# Patient Record
Sex: Male | Born: 1955 | Race: White | Hispanic: No | Marital: Married
Health system: Southern US, Community
[De-identification: ages and names within clinical notes are randomized; demographics above are authoritative.]

## PROBLEM LIST (undated history)

## (undated) DIAGNOSIS — G610 Guillain-Barre syndrome: Secondary | ICD-10-CM

## (undated) DIAGNOSIS — C61 Malignant neoplasm of prostate: Secondary | ICD-10-CM

## (undated) DIAGNOSIS — I10 Essential (primary) hypertension: Secondary | ICD-10-CM

---

## 1988-02-13 HISTORY — PX: OTHER SURGICAL HISTORY: SHX169

## 2002-02-12 HISTORY — PX: VASECTOMY: SHX75

## 2019-08-26 ENCOUNTER — Other Ambulatory Visit: Payer: Self-pay | Admitting: Urology

## 2019-08-26 DIAGNOSIS — C61 Malignant neoplasm of prostate: Secondary | ICD-10-CM

## 2019-09-26 ENCOUNTER — Ambulatory Visit
Admission: RE | Admit: 2019-09-26 | Discharge: 2019-09-26 | Disposition: A | Discharge status: Home / Self Care | Payer: Medicare Other | Source: Ambulatory Visit | Attending: Urology | Admitting: Urology

## 2019-09-26 ENCOUNTER — Other Ambulatory Visit: Payer: Self-pay

## 2019-09-26 DIAGNOSIS — C61 Malignant neoplasm of prostate: Secondary | ICD-10-CM

## 2019-09-26 MED ORDER — GADOBENATE DIMEGLUMINE 529 MG/ML IV SOLN
15.0000 mL | Freq: Once | INTRAVENOUS | Status: AC | PRN
  Administered 2019-09-26: 15 mL via INTRAVENOUS

## 2020-03-14 DIAGNOSIS — U071 COVID-19: Secondary | ICD-10-CM | POA: Diagnosis not present

## 2021-05-11 DIAGNOSIS — M170 Bilateral primary osteoarthritis of knee: Secondary | ICD-10-CM | POA: Diagnosis not present

## 2021-05-12 DIAGNOSIS — E349 Endocrine disorder, unspecified: Secondary | ICD-10-CM | POA: Diagnosis not present

## 2021-05-12 DIAGNOSIS — R948 Abnormal results of function studies of other organs and systems: Secondary | ICD-10-CM | POA: Diagnosis not present

## 2021-05-19 ENCOUNTER — Other Ambulatory Visit: Payer: Self-pay | Admitting: Urology

## 2021-05-19 DIAGNOSIS — C61 Malignant neoplasm of prostate: Secondary | ICD-10-CM

## 2021-06-30 ENCOUNTER — Ambulatory Visit
Admission: RE | Admit: 2021-06-30 | Discharge: 2021-06-30 | Disposition: A | Discharge status: Home / Self Care | Payer: Medicare Other | Source: Ambulatory Visit | Attending: Urology | Admitting: Urology

## 2021-06-30 DIAGNOSIS — K5730 Diverticulosis of large intestine without perforation or abscess without bleeding: Secondary | ICD-10-CM | POA: Diagnosis not present

## 2021-06-30 DIAGNOSIS — C61 Malignant neoplasm of prostate: Secondary | ICD-10-CM

## 2021-06-30 DIAGNOSIS — N3289 Other specified disorders of bladder: Secondary | ICD-10-CM | POA: Diagnosis not present

## 2021-06-30 MED ORDER — GADOBENATE DIMEGLUMINE 529 MG/ML IV SOLN
20.0000 mL | Freq: Once | INTRAVENOUS | Status: AC | PRN
  Administered 2021-06-30: 20 mL via INTRAVENOUS

## 2021-07-31 DIAGNOSIS — G4710 Hypersomnia, unspecified: Secondary | ICD-10-CM | POA: Diagnosis not present

## 2021-08-01 DIAGNOSIS — G4710 Hypersomnia, unspecified: Secondary | ICD-10-CM | POA: Diagnosis not present

## 2021-08-03 ENCOUNTER — Other Ambulatory Visit: Payer: Self-pay | Admitting: Urology

## 2021-08-03 DIAGNOSIS — M9984 Other biomechanical lesions of sacral region: Secondary | ICD-10-CM

## 2021-08-09 DIAGNOSIS — G4733 Obstructive sleep apnea (adult) (pediatric): Secondary | ICD-10-CM | POA: Diagnosis not present

## 2021-08-12 ENCOUNTER — Ambulatory Visit
Admission: RE | Admit: 2021-08-12 | Discharge: 2021-08-12 | Disposition: A | Discharge status: Home / Self Care | Payer: Medicare Other | Source: Ambulatory Visit | Attending: Urology | Admitting: Urology

## 2021-08-12 DIAGNOSIS — N3289 Other specified disorders of bladder: Secondary | ICD-10-CM | POA: Diagnosis not present

## 2021-08-12 DIAGNOSIS — M160 Bilateral primary osteoarthritis of hip: Secondary | ICD-10-CM | POA: Diagnosis not present

## 2021-08-12 DIAGNOSIS — M533 Sacrococcygeal disorders, not elsewhere classified: Secondary | ICD-10-CM | POA: Diagnosis not present

## 2021-08-12 DIAGNOSIS — M9984 Other biomechanical lesions of sacral region: Secondary | ICD-10-CM

## 2021-08-12 DIAGNOSIS — G4733 Obstructive sleep apnea (adult) (pediatric): Secondary | ICD-10-CM | POA: Diagnosis not present

## 2021-08-12 MED ORDER — GADOBENATE DIMEGLUMINE 529 MG/ML IV SOLN
20.0000 mL | Freq: Once | INTRAVENOUS | Status: AC | PRN
  Administered 2021-08-12: 20 mL via INTRAVENOUS

## 2021-08-24 ENCOUNTER — Other Ambulatory Visit: Payer: Self-pay | Admitting: Urology

## 2021-08-24 ENCOUNTER — Other Ambulatory Visit (HOSPITAL_COMMUNITY): Payer: Self-pay | Admitting: Urology

## 2021-08-24 DIAGNOSIS — D480 Neoplasm of uncertain behavior of bone and articular cartilage: Secondary | ICD-10-CM

## 2021-08-25 DIAGNOSIS — G4733 Obstructive sleep apnea (adult) (pediatric): Secondary | ICD-10-CM | POA: Diagnosis not present

## 2021-09-05 DIAGNOSIS — G4733 Obstructive sleep apnea (adult) (pediatric): Secondary | ICD-10-CM | POA: Diagnosis not present

## 2021-09-08 DIAGNOSIS — R935 Abnormal findings on diagnostic imaging of other abdominal regions, including retroperitoneum: Secondary | ICD-10-CM | POA: Diagnosis not present

## 2021-09-08 DIAGNOSIS — R222 Localized swelling, mass and lump, trunk: Secondary | ICD-10-CM | POA: Diagnosis not present

## 2021-09-08 DIAGNOSIS — M533 Sacrococcygeal disorders, not elsewhere classified: Secondary | ICD-10-CM | POA: Diagnosis not present

## 2021-09-08 DIAGNOSIS — D480 Neoplasm of uncertain behavior of bone and articular cartilage: Secondary | ICD-10-CM | POA: Diagnosis not present

## 2021-09-18 ENCOUNTER — Encounter: Payer: Self-pay | Admitting: *Deleted

## 2021-09-18 NOTE — Progress Notes (Unsigned)
Keith Peaches, MD  Keith Mckusick, DO; Keith Medici Noreene Filbert, MD; Keith Mariscal, MD Cc: P Ir Procedure Requests I spoke with Dr. Junious Silk about this case and recommended the CT scan.  This guy is in a tough position and open biopsy would be a much bigger procedure.  I believe it can be approached with CT guidance.   Please schedule for me at [facility].   Thanks,   HKM

## 2021-09-25 DIAGNOSIS — G4733 Obstructive sleep apnea (adult) (pediatric): Secondary | ICD-10-CM | POA: Diagnosis not present

## 2021-10-05 ENCOUNTER — Ambulatory Visit (HOSPITAL_COMMUNITY): Payer: Medicare Other

## 2021-10-05 ENCOUNTER — Encounter (HOSPITAL_COMMUNITY): Payer: Self-pay

## 2021-10-06 DIAGNOSIS — M5451 Vertebrogenic low back pain: Secondary | ICD-10-CM | POA: Diagnosis not present

## 2021-10-09 DIAGNOSIS — M9984 Other biomechanical lesions of sacral region: Secondary | ICD-10-CM | POA: Diagnosis not present

## 2021-10-12 DIAGNOSIS — G610 Guillain-Barre syndrome: Secondary | ICD-10-CM | POA: Diagnosis not present

## 2021-10-12 DIAGNOSIS — M533 Sacrococcygeal disorders, not elsewhere classified: Secondary | ICD-10-CM | POA: Diagnosis not present

## 2021-10-12 DIAGNOSIS — Z880 Allergy status to penicillin: Secondary | ICD-10-CM | POA: Diagnosis not present

## 2021-10-26 DIAGNOSIS — G4733 Obstructive sleep apnea (adult) (pediatric): Secondary | ICD-10-CM | POA: Diagnosis not present

## 2021-10-27 DIAGNOSIS — Z7185 Encounter for immunization safety counseling: Secondary | ICD-10-CM | POA: Diagnosis not present

## 2021-10-27 DIAGNOSIS — I10 Essential (primary) hypertension: Secondary | ICD-10-CM | POA: Diagnosis not present

## 2021-10-27 DIAGNOSIS — Z136 Encounter for screening for cardiovascular disorders: Secondary | ICD-10-CM | POA: Diagnosis not present

## 2021-10-27 DIAGNOSIS — Z87891 Personal history of nicotine dependence: Secondary | ICD-10-CM | POA: Diagnosis not present

## 2021-10-27 DIAGNOSIS — Z8669 Personal history of other diseases of the nervous system and sense organs: Secondary | ICD-10-CM | POA: Diagnosis not present

## 2021-10-27 DIAGNOSIS — Z0000 Encounter for general adult medical examination without abnormal findings: Secondary | ICD-10-CM | POA: Diagnosis not present

## 2021-10-27 DIAGNOSIS — Z1211 Encounter for screening for malignant neoplasm of colon: Secondary | ICD-10-CM | POA: Diagnosis not present

## 2021-10-27 DIAGNOSIS — Z1159 Encounter for screening for other viral diseases: Secondary | ICD-10-CM | POA: Diagnosis not present

## 2021-11-02 DIAGNOSIS — G4733 Obstructive sleep apnea (adult) (pediatric): Secondary | ICD-10-CM | POA: Diagnosis not present

## 2021-11-10 DIAGNOSIS — I10 Essential (primary) hypertension: Secondary | ICD-10-CM | POA: Diagnosis not present

## 2021-11-10 DIAGNOSIS — M9984 Other biomechanical lesions of sacral region: Secondary | ICD-10-CM | POA: Diagnosis not present

## 2021-11-10 DIAGNOSIS — D492 Neoplasm of unspecified behavior of bone, soft tissue, and skin: Secondary | ICD-10-CM | POA: Diagnosis not present

## 2021-11-10 DIAGNOSIS — D481 Neoplasm of uncertain behavior of connective and other soft tissue: Secondary | ICD-10-CM | POA: Diagnosis not present

## 2021-11-10 DIAGNOSIS — G4733 Obstructive sleep apnea (adult) (pediatric): Secondary | ICD-10-CM | POA: Diagnosis not present

## 2021-11-10 DIAGNOSIS — M533 Sacrococcygeal disorders, not elsewhere classified: Secondary | ICD-10-CM | POA: Diagnosis not present

## 2021-11-10 DIAGNOSIS — G4730 Sleep apnea, unspecified: Secondary | ICD-10-CM | POA: Diagnosis not present

## 2021-11-10 DIAGNOSIS — Z880 Allergy status to penicillin: Secondary | ICD-10-CM | POA: Diagnosis not present

## 2021-11-25 DIAGNOSIS — G4733 Obstructive sleep apnea (adult) (pediatric): Secondary | ICD-10-CM | POA: Diagnosis not present

## 2021-12-22 DIAGNOSIS — D509 Iron deficiency anemia, unspecified: Secondary | ICD-10-CM | POA: Diagnosis not present

## 2021-12-26 DIAGNOSIS — G4733 Obstructive sleep apnea (adult) (pediatric): Secondary | ICD-10-CM | POA: Diagnosis not present

## 2022-01-01 DIAGNOSIS — G4733 Obstructive sleep apnea (adult) (pediatric): Secondary | ICD-10-CM | POA: Diagnosis not present

## 2022-01-02 DIAGNOSIS — R7301 Impaired fasting glucose: Secondary | ICD-10-CM | POA: Diagnosis not present

## 2022-01-02 DIAGNOSIS — E782 Mixed hyperlipidemia: Secondary | ICD-10-CM | POA: Diagnosis not present

## 2022-01-02 DIAGNOSIS — Z122 Encounter for screening for malignant neoplasm of respiratory organs: Secondary | ICD-10-CM | POA: Diagnosis not present

## 2022-01-25 DIAGNOSIS — G4733 Obstructive sleep apnea (adult) (pediatric): Secondary | ICD-10-CM | POA: Diagnosis not present

## 2022-02-01 DIAGNOSIS — G4733 Obstructive sleep apnea (adult) (pediatric): Secondary | ICD-10-CM | POA: Diagnosis not present

## 2022-02-25 DIAGNOSIS — G4733 Obstructive sleep apnea (adult) (pediatric): Secondary | ICD-10-CM | POA: Diagnosis not present

## 2022-03-28 DIAGNOSIS — G4733 Obstructive sleep apnea (adult) (pediatric): Secondary | ICD-10-CM | POA: Diagnosis not present

## 2022-04-26 DIAGNOSIS — G4733 Obstructive sleep apnea (adult) (pediatric): Secondary | ICD-10-CM | POA: Diagnosis not present

## 2022-05-27 DIAGNOSIS — G4733 Obstructive sleep apnea (adult) (pediatric): Secondary | ICD-10-CM | POA: Diagnosis not present

## 2022-05-29 ENCOUNTER — Other Ambulatory Visit: Payer: Self-pay | Admitting: Neurological Surgery

## 2022-05-29 DIAGNOSIS — M48061 Spinal stenosis, lumbar region without neurogenic claudication: Secondary | ICD-10-CM

## 2022-06-09 ENCOUNTER — Other Ambulatory Visit: Payer: Medicare Other

## 2022-06-16 ENCOUNTER — Ambulatory Visit
Admission: RE | Admit: 2022-06-16 | Discharge: 2022-06-16 | Disposition: A | Discharge status: Home / Self Care | Payer: Medicare Other | Source: Ambulatory Visit | Attending: Neurological Surgery | Admitting: Neurological Surgery

## 2022-06-16 DIAGNOSIS — M48061 Spinal stenosis, lumbar region without neurogenic claudication: Secondary | ICD-10-CM

## 2022-06-16 DIAGNOSIS — M799 Soft tissue disorder, unspecified: Secondary | ICD-10-CM | POA: Diagnosis not present

## 2022-07-17 ENCOUNTER — Other Ambulatory Visit: Payer: Self-pay | Admitting: Neurological Surgery

## 2022-07-17 DIAGNOSIS — M5416 Radiculopathy, lumbar region: Secondary | ICD-10-CM

## 2022-07-17 DIAGNOSIS — M48061 Spinal stenosis, lumbar region without neurogenic claudication: Secondary | ICD-10-CM | POA: Diagnosis not present

## 2022-12-03 DIAGNOSIS — K921 Melena: Secondary | ICD-10-CM | POA: Diagnosis not present

## 2022-12-03 DIAGNOSIS — Z1211 Encounter for screening for malignant neoplasm of colon: Secondary | ICD-10-CM | POA: Diagnosis not present

## 2022-12-03 DIAGNOSIS — G4730 Sleep apnea, unspecified: Secondary | ICD-10-CM | POA: Diagnosis not present

## 2022-12-12 ENCOUNTER — Other Ambulatory Visit: Payer: Self-pay | Admitting: Gastroenterology

## 2022-12-18 ENCOUNTER — Emergency Department (HOSPITAL_BASED_OUTPATIENT_CLINIC_OR_DEPARTMENT_OTHER): Payer: Medicare Other

## 2022-12-18 ENCOUNTER — Emergency Department (HOSPITAL_BASED_OUTPATIENT_CLINIC_OR_DEPARTMENT_OTHER): Payer: Medicare Other | Admitting: Radiology

## 2022-12-18 ENCOUNTER — Other Ambulatory Visit: Payer: Self-pay

## 2022-12-18 ENCOUNTER — Encounter (HOSPITAL_BASED_OUTPATIENT_CLINIC_OR_DEPARTMENT_OTHER): Payer: Self-pay | Admitting: Emergency Medicine

## 2022-12-18 ENCOUNTER — Emergency Department (HOSPITAL_BASED_OUTPATIENT_CLINIC_OR_DEPARTMENT_OTHER)
Admission: EM | Admit: 2022-12-18 | Discharge: 2022-12-18 | Disposition: A | Discharge status: Home / Self Care | Payer: Medicare Other | Attending: Emergency Medicine | Admitting: Emergency Medicine

## 2022-12-18 DIAGNOSIS — Z8546 Personal history of malignant neoplasm of prostate: Secondary | ICD-10-CM | POA: Insufficient documentation

## 2022-12-18 DIAGNOSIS — R0602 Shortness of breath: Secondary | ICD-10-CM | POA: Diagnosis not present

## 2022-12-18 DIAGNOSIS — N289 Disorder of kidney and ureter, unspecified: Secondary | ICD-10-CM | POA: Diagnosis not present

## 2022-12-18 DIAGNOSIS — F17200 Nicotine dependence, unspecified, uncomplicated: Secondary | ICD-10-CM | POA: Diagnosis not present

## 2022-12-18 DIAGNOSIS — J9811 Atelectasis: Secondary | ICD-10-CM | POA: Diagnosis not present

## 2022-12-18 DIAGNOSIS — N2889 Other specified disorders of kidney and ureter: Secondary | ICD-10-CM | POA: Diagnosis not present

## 2022-12-18 DIAGNOSIS — R918 Other nonspecific abnormal finding of lung field: Secondary | ICD-10-CM | POA: Diagnosis not present

## 2022-12-18 DIAGNOSIS — I2510 Atherosclerotic heart disease of native coronary artery without angina pectoris: Secondary | ICD-10-CM | POA: Diagnosis not present

## 2022-12-18 DIAGNOSIS — Z79899 Other long term (current) drug therapy: Secondary | ICD-10-CM | POA: Diagnosis not present

## 2022-12-18 DIAGNOSIS — R9389 Abnormal findings on diagnostic imaging of other specified body structures: Secondary | ICD-10-CM | POA: Diagnosis not present

## 2022-12-18 DIAGNOSIS — I10 Essential (primary) hypertension: Secondary | ICD-10-CM | POA: Insufficient documentation

## 2022-12-18 HISTORY — DX: Malignant neoplasm of prostate: C61

## 2022-12-18 HISTORY — DX: Essential (primary) hypertension: I10

## 2022-12-18 HISTORY — DX: Guillain-Barre syndrome: G61.0

## 2022-12-18 LAB — CBC WITH DIFFERENTIAL/PLATELET
Abs Immature Granulocytes: 0.02 10*3/uL (ref 0.00–0.07)
Basophils Absolute: 0 10*3/uL (ref 0.0–0.1)
Basophils Relative: 1 %
Eosinophils Absolute: 0.2 10*3/uL (ref 0.0–0.5)
Eosinophils Relative: 2 %
HCT: 39.2 % (ref 39.0–52.0)
Hemoglobin: 13.1 g/dL (ref 13.0–17.0)
Immature Granulocytes: 0 %
Lymphocytes Relative: 29 %
Lymphs Abs: 1.8 10*3/uL (ref 0.7–4.0)
MCH: 28.6 pg (ref 26.0–34.0)
MCHC: 33.4 g/dL (ref 30.0–36.0)
MCV: 85.6 fL (ref 80.0–100.0)
Monocytes Absolute: 0.6 10*3/uL (ref 0.1–1.0)
Monocytes Relative: 10 %
Neutro Abs: 3.6 10*3/uL (ref 1.7–7.7)
Neutrophils Relative %: 58 %
Platelets: 190 10*3/uL (ref 150–400)
RBC: 4.58 MIL/uL (ref 4.22–5.81)
RDW: 13.7 % (ref 11.5–15.5)
WBC: 6.2 10*3/uL (ref 4.0–10.5)
nRBC: 0 % (ref 0.0–0.2)

## 2022-12-18 LAB — BASIC METABOLIC PANEL
Anion gap: 8 (ref 5–15)
BUN: 10 mg/dL (ref 8–23)
CO2: 26 mmol/L (ref 22–32)
Calcium: 9.4 mg/dL (ref 8.9–10.3)
Chloride: 105 mmol/L (ref 98–111)
Creatinine, Ser: 0.77 mg/dL (ref 0.61–1.24)
GFR, Estimated: >60 mL/min (ref >60)
Glucose, Bld: 120 mg/dL — ABNORMAL HIGH (ref 70–99)
Potassium: 4.2 mmol/L (ref 3.5–5.1)
Sodium: 139 mmol/L (ref 135–145)

## 2022-12-18 LAB — TROPONIN I (HIGH SENSITIVITY)
Troponin I (High Sensitivity): 5 ng/L (ref <18)
Troponin I (High Sensitivity): 5 ng/L (ref <18)

## 2022-12-18 LAB — BRAIN NATRIURETIC PEPTIDE: B Natriuretic Peptide: 17.7 pg/mL (ref 0.0–100.0)

## 2022-12-18 MED ORDER — IOHEXOL 350 MG/ML SOLN
100.0000 mL | Freq: Once | INTRAVENOUS | Status: AC | PRN
  Administered 2022-12-18: 75 mL via INTRAVENOUS

## 2022-12-18 NOTE — ED Triage Notes (Signed)
Pt caox4, ambulatory in lobby c/o SOB on exertion. Pt states it has been ongoing for the past couple months but states it has become more severe over the past 2 days. Pt denies cough, fever, CP, congestion, increased swelling in extremities. Pt denies PMH COPD or CHF.

## 2022-12-18 NOTE — Discharge Instructions (Addendum)
Use incentive spirometer.  Return to the ER for worsening or concerning symptoms.  Follow-up with pulmonology as discussed.  Recheck with your primary care provider to review today's results and consider further imaging as discussed.

## 2022-12-18 NOTE — ED Notes (Signed)
Incentive Spirometry performed by RT.

## 2022-12-18 NOTE — ED Provider Notes (Signed)
[place] EMERGENCY DEPARTMENT AT [facility] Provider Note   CSN: [id number] Arrival date & time: 12/18/22  4098     History  Chief Complaint  Patient presents with   Shortness of Breath    Keith Elliott is a 67 y.o. male.  67 year old male presents with complaint of shortness of breath. Patient reports having COVID for the 2nd time a year ago, has noted since that time progressively worsening SHOB. Patient wears a CPAP at night. Feels like his breathing has been more shallow lately, SHOB worse with exertion and notes he feels SHOB walking less than 100 yards recently. Patient was at work today sitting at his desk and felt [facility] resting which prompted him to come to the ER. Has been to his PCP for same, was told he didn't qualify for screening/free chest imaging since he quit smoking in 1979.  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 Denies lower extremity edema, denies orthopnea, fevers, chills, sweats, chest pain (has chest discomfort which he relates to his [facility]). Denies recent extended travel, personal or family history of PE/DVT. History of prostate cancer 9 years ago with clear bx since that time.        Home Medications Prior to Admission medications   Medication Sig Start Date End Date Taking? Authorizing Provider  alfuzosin (UROXATRAL) 10 MG 24 hr tablet Take 10 mg by mouth daily.    [provider]  losartan (COZAAR) 25 MG tablet Take 25 mg by mouth daily.    [provider]      Allergies    Haemophilus influenzae vaccines and Penicillins    Review of Systems   Review of Systems Negative except as per HPI Physical Exam Updated Vital Signs BP 136/83 (BP Location: Right Arm)   Pulse (!) 59   Temp 98.1 F (36.7 C) (Oral)   Resp 17   Ht 5' 8.5" (1.74 m)   Wt 99.3 kg   SpO2 97%   BMI 32.81 kg/m  Physical Exam Vitals and nursing note  reviewed.  Constitutional:      General: He is not in acute distress.    Appearance: He is well-developed. He is not diaphoretic.  HENT:     Head: Normocephalic and atraumatic.  Cardiovascular:     Rate and Rhythm: Normal rate and regular rhythm.     Heart sounds: No murmur heard. Pulmonary:     Effort: Pulmonary effort is normal.     Breath sounds: Normal breath sounds. No decreased breath sounds.  Musculoskeletal:     Right lower leg: No tenderness. No edema.     Left lower leg: No tenderness. No edema.  Skin:    General: Skin is warm and dry.     Findings: No erythema or rash.  Neurological:     Mental Status: He is alert and oriented to person, place, and time.  Psychiatric:        Behavior: Behavior normal.     ED Results / Procedures / Treatments   Labs (all labs ordered are listed, but only abnormal results are displayed) Labs Reviewed  BASIC METABOLIC PANEL - Abnormal; Notable for the following components:      Result Value   Glucose, Bld 120 (*)    All other components within normal limits  CBC WITH DIFFERENTIAL/PLATELET  BRAIN NATRIURETIC PEPTIDE  TROPONIN I (HIGH SENSITIVITY)  TROPONIN I (HIGH SENSITIVITY)  EKG EKG Interpretation Date/Time:  Tuesday December 18 2022 09:23:49 EST Ventricular Rate:  64 PR Interval:  140 QRS Duration:  104 QT Interval:  417 QTC Calculation: 431 R Axis:   -28  Text Interpretation: Sinus rhythm Borderline left axis deviation Low voltage, precordial leads Abnormal R-wave progression, early transition Confirmed by Margarita Grizzle [phone]) on 12/18/2022 1:27:06 PM  Radiology CT Angio Chest PE W/Cm &/Or Wo Cm  Result Date: 12/18/2022 CLINICAL DATA:  Several month history of shortness of breath on exertion, acutely worsening over the last 2 days. EXAM: CT ANGIOGRAPHY CHEST WITH CONTRAST TECHNIQUE: Multidetector CT imaging of the chest was performed using the standard protocol during bolus administration of intravenous contrast.  Multiplanar CT image reconstructions and MIPs were obtained to evaluate the vascular anatomy. RADIATION DOSE REDUCTION: This exam was performed according to the departmental dose-optimization program which includes automated exposure control, adjustment of the mA and/or kV according to patient size and/or use of iterative reconstruction technique. CONTRAST:  75mL OMNIPAQUE IOHEXOL 350 MG/ML SOLN COMPARISON:  Same day chest radiograph FINDINGS: Cardiovascular: The study is high quality for the evaluation of pulmonary embolism. There are no filling defects in the central, lobar, segmental or subsegmental pulmonary artery branches to suggest acute pulmonary embolism. Great vessels are normal in course and caliber. Normal heart size. No significant pericardial fluid/thickening. Coronary artery calcifications and aortic atherosclerosis. Mediastinum/Nodes: Imaged thyroid gland without nodules meeting criteria for imaging follow-up by size. Normal esophagus. No pathologically enlarged axillary, supraclavicular, mediastinal, or hilar lymph nodes. Partially calcified subcarinal lymph node. Lungs/Pleura: The central airways are patent. Asymmetric elevation of the right hemidiaphragm. Compressive right lower lobe subsegmental atelectasis. Mild diffuse bronchial wall thickening. No pneumothorax. No pleural effusion. Upper abdomen: Diffuse parenchymal hypoattenuation can be seen with hepatic steatosis. Mildly hyperattenuating 1.4 cm exophytic lesion arising from the upper pole right kidney (4:136). Musculoskeletal: No acute or abnormal lytic or blastic osseous lesions. Multilevel degenerative changes of the thoracic spine. Review of the MIP images confirms the above findings. IMPRESSION: 1. No evidence of pulmonary embolism. 2. Mild diffuse bronchial wall thickening, which may reflect bronchitis. 3. Asymmetric elevation of the right hemidiaphragm with compressive right lower lobe subsegmental atelectasis. 4. Mildly  hyperattenuating 1.4 cm exophytic lesion arising from the upper pole right kidney, which may represent a hemorrhagic or proteinaceous cyst. Recommend further evaluation with nonemergent contrast-enhanced MRI abdomen or renal protocol CT abdomen. 5. Hepatic steatosis. 6. Aortic Atherosclerosis (ICD10-I70.0). Coronary artery calcifications. Assessment for potential risk factor modification, dietary therapy or pharmacologic therapy may be warranted, if clinically indicated. Electronically Signed   By: Agustin Cree M.D.   On: 12/18/2022 16:25   DG Chest 2 View  Result Date: 12/18/2022 CLINICAL DATA:  Shortness of breath on exertion. Ongoing for a couple of months but more severe over the past 2 days. EXAM: CHEST - 2 VIEW COMPARISON:  None Available. FINDINGS: Cardiac silhouette and mediastinal contours are within limits. Mild-to-moderate elevation of the right hemidiaphragm. Mildly to moderately decreased lung volumes. No focal airspace opacity. No pleural effusion pneumothorax. Mild-to-moderate multilevel degenerative disc changes of the thoracic spine. IMPRESSION: 1. Mild-to-moderate elevation of the right hemidiaphragm. 2. No acute cardiopulmonary process. Electronically Signed   By: Neita Garnet M.D.   On: 12/18/2022 12:47    Procedures Procedures    Medications Ordered in ED Medications  iohexol (OMNIPAQUE) 350 MG/ML injection 100 mL (75 mLs Intravenous Contrast Given 12/18/22 1349)    ED Course/ Medical Decision Making/ A&P  Medical Decision Making Amount and/or Complexity of Data Reviewed Labs: ordered. Radiology: ordered.  Risk Prescription drug management.   This patient presents to the ED for concern of [facility], this involves an extensive number of treatment options, and is a complaint that carries with it a high risk of complications and morbidity.  The differential diagnosis includes PE, pneumonia, ACS, CHF, pneumothorax   Co morbidities that complicate  the patient evaluation  Hypertension, prostate cancer, Guillain-Barr   Additional history obtained:  External records from outside source obtained and reviewed including prior labs on file for comparison   Lab Tests:  I Ordered, and personally interpreted labs.  The pertinent results include: CBC within normal notes.  BMP without segment findings.  Troponins are without scant findings at 5 and 5.  BNP within normal notes.   Imaging Studies ordered:  I ordered imaging studies including chest x-ray, CTA chest I independently visualized and interpreted imaging which showed elevated right hemidiaphragm I agree with the radiologist interpretation, atelectasis right lower lobe with elevation of right hemidiaphragm, hepatic steatosis, renal lesion requiring nonemergent outpatient workup.   Cardiac Monitoring: / EKG:  The patient was maintained on a cardiac monitor.  I personally viewed and interpreted the cardiac monitored which showed an underlying rhythm of: Sinus rhythm, rate 64   Consultations Obtained:  I requested consultation with the ER attending, Dr. Rosalia Hammers,  and discussed lab and imaging findings as well as pertinent plan - they recommend: Agrees with plan of care   Problem List / ED Course / Critical interventions / Medication management  67 year old male with complaint of shortness of breath ongoing for the past year, worse recently, worse with exertion without chest pain.  On exam, nontoxic, no distress, lungs clear to auscultation.  Is found to have elevated right hemidiaphragm, CT negative for PE, question right lower lobe atelectasis.  Question if this is contributing to patient's symptoms of feeling like he is unable to take a good deep breath.  No evidence of CHF on CT/chest x-ray/negative BNP.  Doubt ACS given lack of chest pain and normal troponins.  Incidental findings on CT reviewed with patient can be worked up further outpatient with PCP.  Provided with incentive  spirometer with return to ER precautions and plan for follow-up with PCP and pulmonology. I ordered incentive spirometer for discharge for atelectasis I have reviewed the patients home medicines and have made adjustments as needed   Social Determinants of Health:  Has PCP, has arranged for follow-up with pulmonology   Test / Admission - Considered:  Stable for discharge with plan for follow-up as discussed         Final Clinical Impression(s) / ED Diagnoses Final diagnoses:  Shortness of breath  Atelectasis of right lung  Renal lesion    Rx / DC Orders ED Discharge Orders     None         Jeannie Fend, PA-C 12/18/22 1658    Margarita Grizzle, MD 12/19/22 1234

## 2022-12-18 NOTE — Progress Notes (Signed)
RT ambulated the Pt on RA. SATS were 95-97%.

## 2022-12-18 NOTE — ED Notes (Signed)
Discharge paperwork given and verbally understood. 

## 2023-01-17 ENCOUNTER — Ambulatory Visit (HOSPITAL_BASED_OUTPATIENT_CLINIC_OR_DEPARTMENT_OTHER): Payer: Medicare Other | Admitting: Pulmonary Disease

## 2023-01-17 ENCOUNTER — Encounter (HOSPITAL_BASED_OUTPATIENT_CLINIC_OR_DEPARTMENT_OTHER): Payer: Self-pay

## 2023-01-17 ENCOUNTER — Encounter (HOSPITAL_BASED_OUTPATIENT_CLINIC_OR_DEPARTMENT_OTHER): Payer: Self-pay | Admitting: Pulmonary Disease

## 2023-01-17 VITALS — BP 138/84 | HR 53 | Resp 16 | Ht 68.5 in | Wt 223.4 lb

## 2023-01-17 DIAGNOSIS — R0602 Shortness of breath: Secondary | ICD-10-CM | POA: Diagnosis not present

## 2023-01-17 DIAGNOSIS — G4733 Obstructive sleep apnea (adult) (pediatric): Secondary | ICD-10-CM

## 2023-01-17 DIAGNOSIS — J986 Disorders of diaphragm: Secondary | ICD-10-CM | POA: Diagnosis not present

## 2023-01-17 NOTE — Patient Instructions (Addendum)
Shortness of breath Elevated right diaphragm --SNIFF XR --Pulmonary function tests  CPAP CLEANING INSTRUCTIONS Along with proper CPAP cleaning it is recommended that you replace your mask, tubing and filters once very 3 months and more frequently if you are sick.  DAILY CLEANING Do NOT use moisturizing soaps, bleach, scented oils, chlorine, or alcohol-based solutions to clean your supplies. These solutions may cause irritation to your skin and lungs and may reduce the life of your products. Dawn [employer] or Comparable works best for daily cleaning.  **If you've been sick, it's smart to wash your mask, tubing, humidifier and filter daily until your cold, flu or virus symptoms are gone. That can help reduce the amount of time you spend under the weather.  Before using your mask -wash your face daily with soap and water to remove excess facial oils.  Wipe down your mask (including areas that come in contact with your skin) using a damp towel with soap and warm water. This will remove any oils, dead skin cells, and sweat on the mask that can affect the quality of the seal. Gently rinse with a clean towel and let the mask air-dry out of direct sunlight.  You can also use unscented baby wipes or pre-moistened towels designed specifically for cleaning CPAP masks, which are available on-line. DO NOT USE CLOROX OR DISINFECTING WIPES.  If your unit has a humidifier, empty any leftover water instead of letting in sit in the unit all day. Refill the humidifier with clean, distilled water right before bedtime for optimal use  WEEKLY (OR MORE FREQUENT) CLEANING Your mask and tubing need a full bath at least once a week to keep it free of dust, bacteria, and germs. (During COVID-19 or any other flu/virus we recommend more frequent cleaning)  Clean the CPAP tubing, nasal mask, and headgear in a bathroom sink filled with warm water and a few drops of ammonia-free, mild dish detergent. Avoid using stronger  cleaning products, as they may damage the mask or leave harmful residue.   Swirl all parts around for about five minutes, rinse well and let air dry during the day. Hang the tubing over the shower rod, on a towel rack or in the laundry room to ensure all the water drips out.  The mask and headgear can be air-dried on a towel or hung on a hook or hanger.  You should also wipe down your CPAP machine with a damp cloth. Ensure the unit is unplugged. The towel shouldn't be too damp or wet, as water could get into the machine.  Clean the filter by removing it and rinsing it in warm tap water. Run it under the water and squeeze to make sure there is no dust. Then blot down the filter with a towel.  DO NOT wash your machine's white filter, if one is present--those are disposable and you should replace white filter every two weeks. If you are recovering from being sick, we recommend changing the filter sooner.  If your CPAP has a humidifier, that also needs to be cleaned weekly. Empty any remaining water and then wash the water chamber in the sink with warm soapy water. Rinse well and drain out as much of the water as possible. Let the chamber air-dry before placing it back into the CPAP unit.  Every other week you should disinfect the humidifier. Do that by soaking it in a solution of one-part vinegar to five parts water for 30 minutes, thoroughly rinsing and then placing in  your dishwasher's top rack for washing. And keep it clean by using only distilled water to prevent mineral deposits that can build up and cause damage to your machine.  IMPORTANT TIPS Make caring for your CPAP equipment part of your morning routine. Keep machine and accessories out of direct sunlight to avoid damaging them. Never use bleach to clean accessories. Place machine on a level surface and away from curtains that may interfere with the air intake.

## 2023-01-17 NOTE — Progress Notes (Signed)
Subjective:   PATIENT ID: Keith Elliott GENDER: male DOB: 10/07/1955, MRN: [id number]  Chief Complaint  Patient presents with   Establish Care    ED Fu for difficulty breathing. Still having it. 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  He was recently seen in the [facility] ED on 12/18/22 for shortness of breath. He reported having covid for the second time a year ago and since then has had progressively worsening shortness of breath but may have progressed longer than this. Difficulty walking >100 yards but noticed shortness of breath which prompted his ED visit.  He reports that rest and CPAP improves his shortness of breath. Chest imaging demonstrated elevated diaphragm which he reports he was not aware of. Denies any cardiac or back surgeries. He was diagnosed with OSA 1.5 year ago. He wears his CPAP nightly for >4 hours and believes his events are low. He reports daily productive cough in the morning that resolves after expelling. Will wheeze with activity sometimes but shortness of breath is primary concern. Not active at baseline.  Social History: Former smoker. 10 pack years (2ppd x 4 years). Quit in 1980  I have personally reviewed patient's past medical/family/social history, allergies, current medications.  Past Medical History:  Diagnosis Date   Guillain Barr syndrome (HCC)    Hypertension    Prostate cancer (HCC)      Family History  Problem Relation Age of Onset   Alzheimer's disease Father      Social History   Occupational History   Not on file  Tobacco Use   Smoking status: Former    Current packs/day: 0.00    Types: Cigarettes    Quit date: 1979    Years  since quitting: 45.9    Passive exposure: Past   Smokeless tobacco: Never  Vaping Use   Vaping status: Never Used  Substance and Sexual Activity   Alcohol use: Not Currently   Drug use: Not Currently   Sexual activity: Not on file    Allergies  Allergen Reactions   Haemophilus Influenzae Vaccines Other (See Comments)    Guillian-Barre   Penicillins Rash     Outpatient Medications Prior to Visit  Medication Sig Dispense Refill   alfuzosin (UROXATRAL) 10 MG 24 hr tablet Take 10 mg by mouth daily.     losartan (COZAAR) 25 MG tablet Take 25 mg by mouth daily.     No facility-administered medications prior to visit.    Review of Systems  Constitutional:  Negative for chills, diaphoresis, fever, malaise/fatigue and weight loss.  HENT:  Positive for sore throat. Negative for congestion.   Respiratory:  Positive for cough, sputum production and shortness of breath. Negative for hemoptysis and wheezing.   Cardiovascular:  Negative for chest pain, palpitations and leg swelling.  Musculoskeletal:  Positive for joint pain.     Objective:   Vitals:   01/17/23 0903  BP: 138/84  Pulse: (!) 53  Resp: 16  SpO2: 98%  Weight: 223 lb 6.4 oz (101.3 kg)  Height: 5' 8.5" (1.74 m)   SpO2: 98 %  Physical Exam: General: Well-appearing, no acute distress HENT: [place], AT Eyes: EOMI, no scleral icterus  Respiratory: Clear to auscultation bilaterally.  No crackles, wheezing or rales Cardiovascular: RRR, -M/R/G, no JVD Extremities:-Edema,-tenderness Neuro: AAO x4, CNII-XII grossly intact Psych: Normal mood, normal affect  Data Reviewed:  Imaging: CTA 12/18/22 - No PE, mild diffuse bronchial wall thickening, right hemidiaphragm elevation with RLL subsegmental atelectasis  PFT: None on file  Labs: CBC    Component Value Date/Time   WBC 6.2 12/18/2022 1007   RBC 4.58 12/18/2022 1007   HGB 13.1 12/18/2022 1007   HCT 39.2 12/18/2022 1007   PLT 190 12/18/2022 1007   MCV 85.6  12/18/2022 1007   MCH 28.6 12/18/2022 1007   MCHC 33.4 12/18/2022 1007   RDW 13.7 12/18/2022 1007   LYMPHSABS 1.8 12/18/2022 1007   MONOABS 0.6 12/18/2022 1007   EOSABS 0.2 12/18/2022 1007   BASOSABS 0.0 12/18/2022 1007   Absolute eos 12/18/22 - 200     Assessment & Plan:   Discussion: 67 year old male former smoker with OSA on CPAP, HTN, HLD, IDA, 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 OSA seems controlled. Elevated diaphragm present for unknown period time with no prior imaging available. Deconditioning likely contributing.  Shortness of breath Elevated right diaphragm --SNIFF XR --Pulmonary function tests --Consider CT Surgery for evaluation  OSA on CPAP --Provided CPAP cleaning instruction  Health Maintenance  There is no immunization history on file for this patient. CT Lung Screen - not qualified  Orders Placed This Encounter  Procedures   DG Sniff Test    Standing Status:   Future    Standing Expiration Date:   01/17/2024    Order Specific Question:   Reason for Exam (SYMPTOM  OR DIAGNOSIS REQUIRED)    Answer:   right elevated diaphragm    Order Specific Question:   Preferred imaging location?    Answer:   [facility]   Pulmonary function test    Standing Status:   Future    Standing Expiration Date:   01/17/2024    Order Specific Question:   Where should this test be performed?    Answer:   [facility]    Order Specific Question:   Full PFT: includes the following: basic spirometry, spirometry pre & post bronchodilator, diffusion capacity (DLCO), lung volumes    Answer:   Full PFT  No orders of the defined types were placed in this encounter.   Return for after PFT in January. OK to used blocked spots to discuss results.  I have spent a total time of 45-minutes on the day of the appointment reviewing prior documentation, coordinating care and  discussing medical diagnosis and plan with the patient/family. Imaging, labs and tests included in this note have been reviewed and interpreted independently by me.  Gavan Nordby Mechele Collin, MD [facility] 01/17/2023 9:36 AM

## 2023-01-21 ENCOUNTER — Other Ambulatory Visit: Payer: Medicare Other

## 2023-01-22 ENCOUNTER — Other Ambulatory Visit (HOSPITAL_BASED_OUTPATIENT_CLINIC_OR_DEPARTMENT_OTHER): Payer: Self-pay

## 2023-01-23 ENCOUNTER — Other Ambulatory Visit (HOSPITAL_BASED_OUTPATIENT_CLINIC_OR_DEPARTMENT_OTHER): Payer: Self-pay

## 2023-01-23 DIAGNOSIS — Z8669 Personal history of other diseases of the nervous system and sense organs: Secondary | ICD-10-CM | POA: Diagnosis not present

## 2023-01-23 DIAGNOSIS — G4733 Obstructive sleep apnea (adult) (pediatric): Secondary | ICD-10-CM | POA: Diagnosis not present

## 2023-01-23 DIAGNOSIS — I10 Essential (primary) hypertension: Secondary | ICD-10-CM | POA: Diagnosis not present

## 2023-01-23 DIAGNOSIS — Z9989 Dependence on other enabling machines and devices: Secondary | ICD-10-CM | POA: Diagnosis not present

## 2023-01-23 DIAGNOSIS — Z87891 Personal history of nicotine dependence: Secondary | ICD-10-CM | POA: Diagnosis not present

## 2023-01-23 DIAGNOSIS — Z7185 Encounter for immunization safety counseling: Secondary | ICD-10-CM | POA: Diagnosis not present

## 2023-01-23 DIAGNOSIS — E782 Mixed hyperlipidemia: Secondary | ICD-10-CM | POA: Diagnosis not present

## 2023-01-23 DIAGNOSIS — Z0000 Encounter for general adult medical examination without abnormal findings: Secondary | ICD-10-CM | POA: Diagnosis not present

## 2023-01-23 MED ORDER — CLOMIPHENE CITRATE 50 MG PO TABS
25.0000 mg | ORAL_TABLET | Freq: Every day | ORAL | 4 refills | Status: DC
  Filled 2023-01-23: qty 15, 30d supply, fill #0

## 2023-01-25 DIAGNOSIS — R7309 Other abnormal glucose: Secondary | ICD-10-CM | POA: Diagnosis not present

## 2023-01-30 ENCOUNTER — Other Ambulatory Visit (HOSPITAL_BASED_OUTPATIENT_CLINIC_OR_DEPARTMENT_OTHER): Payer: Self-pay | Admitting: Family Medicine

## 2023-01-30 DIAGNOSIS — E782 Mixed hyperlipidemia: Secondary | ICD-10-CM

## 2023-02-04 ENCOUNTER — Other Ambulatory Visit: Payer: Medicare Other

## 2023-02-04 ENCOUNTER — Ambulatory Visit
Admission: RE | Admit: 2023-02-04 | Discharge: 2023-02-04 | Disposition: A | Discharge status: Home / Self Care | Payer: Medicare Other | Source: Ambulatory Visit | Attending: Pulmonary Disease

## 2023-02-04 DIAGNOSIS — R0602 Shortness of breath: Secondary | ICD-10-CM

## 2023-02-04 DIAGNOSIS — J986 Disorders of diaphragm: Secondary | ICD-10-CM | POA: Diagnosis not present

## 2023-02-05 ENCOUNTER — Other Ambulatory Visit (HOSPITAL_BASED_OUTPATIENT_CLINIC_OR_DEPARTMENT_OTHER): Payer: Self-pay

## 2023-02-07 ENCOUNTER — Ambulatory Visit (HOSPITAL_COMMUNITY)
Admission: RE | Admit: 2023-02-07 | Discharge: 2023-02-07 | Disposition: A | Discharge status: Home / Self Care | Payer: Self-pay | Source: Ambulatory Visit | Attending: Family Medicine | Admitting: Family Medicine

## 2023-02-07 DIAGNOSIS — E782 Mixed hyperlipidemia: Secondary | ICD-10-CM | POA: Insufficient documentation

## 2023-02-11 DIAGNOSIS — E785 Hyperlipidemia, unspecified: Secondary | ICD-10-CM | POA: Diagnosis not present

## 2023-02-11 DIAGNOSIS — E1169 Type 2 diabetes mellitus with other specified complication: Secondary | ICD-10-CM | POA: Diagnosis not present

## 2023-02-14 ENCOUNTER — Ambulatory Visit (HOSPITAL_BASED_OUTPATIENT_CLINIC_OR_DEPARTMENT_OTHER): Payer: Medicare Other | Admitting: Pulmonary Disease

## 2023-02-14 ENCOUNTER — Encounter (HOSPITAL_BASED_OUTPATIENT_CLINIC_OR_DEPARTMENT_OTHER): Payer: Self-pay | Admitting: Pulmonary Disease

## 2023-02-14 VITALS — BP 130/68 | HR 69 | Resp 18 | Ht 67.75 in | Wt 225.4 lb

## 2023-02-14 DIAGNOSIS — G4733 Obstructive sleep apnea (adult) (pediatric): Secondary | ICD-10-CM

## 2023-02-14 DIAGNOSIS — R0602 Shortness of breath: Secondary | ICD-10-CM | POA: Diagnosis not present

## 2023-02-14 LAB — PULMONARY FUNCTION TEST
DL/VA % pred: 157 %
DL/VA: 6.49 ml/min/mmHg/L
DLCO cor % pred: 100 %
DLCO cor: 24.55 ml/min/mmHg
DLCO unc % pred: 95 %
DLCO unc: 23.45 ml/min/mmHg
FEF 25-75 Post: 3.46 L/s
FEF 25-75 Pre: 2.38 L/s
FEF2575-%Change-Post: 45 %
FEF2575-%Pred-Post: 146 %
FEF2575-%Pred-Pre: 100 %
FEV1-%Change-Post: 6 %
FEV1-%Pred-Post: 64 %
FEV1-%Pred-Pre: 61 %
FEV1-Post: 1.98 L
FEV1-Pre: 1.86 L
FEV1FVC-%Change-Post: 3 %
FEV1FVC-%Pred-Pre: 114 %
FEV6-%Change-Post: 2 %
FEV6-%Pred-Post: 57 %
FEV6-%Pred-Pre: 56 %
FEV6-Post: 2.25 L
FEV6-Pre: 2.19 L
FEV6FVC-%Pred-Post: 106 %
FEV6FVC-%Pred-Pre: 106 %
FVC-%Change-Post: 2 %
FVC-%Pred-Post: 54 %
FVC-%Pred-Pre: 53 %
FVC-Post: 2.25 L
FVC-Pre: 2.19 L
Post FEV1/FVC ratio: 88 %
Post FEV6/FVC ratio: 100 %
Pre FEV1/FVC ratio: 85 %
Pre FEV6/FVC Ratio: 100 %
RV % pred: 78 %
RV: 1.79 L
TLC % pred: 61 %
TLC: 4.02 L

## 2023-02-14 MED ORDER — ALBUTEROL SULFATE HFA 108 (90 BASE) MCG/ACT IN AERS: 2.0000 | INHALATION_SPRAY | Freq: Four times a day (QID) | RESPIRATORY_TRACT | 2 refills | Status: AC | PRN

## 2023-02-14 NOTE — Progress Notes (Signed)
 Subjective:   PATIENT ID: Keith Elliott GENDER: male DOB: Oct 28, 1955, MRN: [id number]  Chief Complaint  Patient presents with   Follow-up    PFT results    Reason for Visit: Follow-up  Mr. 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 He reported having covid for the second time a year ago and since then has had progressively worsening shortness of breath but may have progressed longer than this. Difficulty walking >100 yards but noticed shortness of breath which prompted his ED visit.  He reports that rest and CPAP improves his shortness of breath. Chest imaging demonstrated elevated diaphragm which he reports he was not aware of. Denies any cardiac or back surgeries. He was diagnosed with OSA 1.5 year ago. He wears his CPAP nightly for >4 hours and believes his events are low. He reports daily productive cough in the morning that resolves after expelling. Will wheeze with activity sometimes but shortness of breath is primary concern. Not active at baseline.  02/14/23 Since our last visit he had SNIFF test and PFT completed. Has shortness of breath with activity.   Social History: Former smoker. 10 pack years (2ppd x 4 years). Quit in 1980  I have personally reviewed patient's past medical/family/social history, allergies, current medications.  Past Medical History:  Diagnosis Date   Guillain Barr syndrome (HCC)    Hypertension    Prostate cancer (HCC)      Family History  Problem Relation Age of Onset   Alzheimer's disease Father      Social History   Occupational History   Not on file  Tobacco Use   Smoking status: Former    Current packs/day: 0.00    Types: Cigarettes    Quit date: 1979    Years  since quitting: 46.0    Passive exposure: Past   Smokeless tobacco: Never  Vaping Use   Vaping status: Never Used  Substance and Sexual Activity   Alcohol use: Not Currently   Drug use: Not Currently   Sexual activity: Not on file    Allergies  Allergen Reactions   Haemophilus Influenzae Vaccines Other (See Comments)    Guillian-Barre   Penicillins Rash     Outpatient Medications Prior to Visit  Medication Sig Dispense Refill   alfuzosin (UROXATRAL) 10 MG 24 hr tablet Take 10 mg by mouth daily.     losartan (COZAAR) 25 MG tablet Take 25 mg by mouth daily.     clomiPHENE  (CLOMID ) 50 MG tablet Take 0.5 tablets (25 mg total) by mouth daily. 15 tablet 4   No facility-administered medications prior to visit.    Review of Systems  Constitutional:  Negative for chills, diaphoresis, fever, malaise/fatigue and weight loss.  HENT:  Negative for congestion.   Respiratory:  Positive for shortness of breath. Negative for cough, hemoptysis, sputum production and wheezing.   Cardiovascular:  Negative for chest pain, palpitations and leg swelling.     Objective:   Vitals:   02/14/23 1011  BP: 130/68  Pulse: 69  Resp: 18  SpO2: 97%  Weight: 225 lb 6.4 oz (102.2 kg)  Height: 5' 7.75 (1.721 m)   SpO2: 97 %  Physical Exam: General: Well-appearing, no acute distress HENT: ,  AT Eyes: EOMI, no scleral icterus Respiratory: Clear to auscultation bilaterally.  No crackles, wheezing or rales Cardiovascular: RRR, -M/R/G, no JVD Extremities:-Edema,-tenderness Neuro: AAO x4, CNII-XII grossly intact Psych: Normal mood, normal affect  Data Reviewed:  Imaging: CTA 12/18/22 - No PE, mild diffuse bronchial wall thickening, right hemidiaphragm elevation with RLL subsegmental atelectasis DG Sniff 02/04/23 - Negative. No evidence of paralysis  PFT: 02/14/23 FVC 2.25 (54%) FEV1 1.98 (64%) Ratio 88  TLC 61% DLCO 95% Interpretation: Moderate restrictive defect with normal  oxygen   Labs: CBC    Component Value Date/Time   WBC 6.2 12/18/2022 1007   RBC 4.58 12/18/2022 1007   HGB 13.1 12/18/2022 1007   HCT 39.2 12/18/2022 1007   PLT 190 12/18/2022 1007   MCV 85.6 12/18/2022 1007   MCH 28.6 12/18/2022 1007   MCHC 33.4 12/18/2022 1007   RDW 13.7 12/18/2022 1007   LYMPHSABS 1.8 12/18/2022 1007   MONOABS 0.6 12/18/2022 1007   EOSABS 0.2 12/18/2022 1007   BASOSABS 0.0 12/18/2022 1007   Absolute eos 12/18/22 - 200     Assessment & Plan:   Discussion: 68 year old male former smoker with OSA on CPAP, HTN, HLD, IDA, 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 OSA seems controlled. Elevated diaphragm present for unknown period time with no prior imaging available. Deconditioning likely contributing.  Shortness of breath Chronic bronchitis Elevated right diaphragm --SNIFF XR - negative SNIFF, no paralyzed. Not likely a candidate for surgery --Reviewed pulmonary function tests. Restrictive defect but normal lung function. This is suggestive of diaphragm weakness --Holding CT Surgery referral. But can reconsider if symptoms worsen --Encourage regular exercise including yoga, upper body strengthening/rubber bands and cardio exercises five days a week --START albuterol  TWO puffs as needed for shortness of breath or wheezing  OSA on CPAP --Continue CPAP nightly  Health Maintenance  There is no immunization history on file for this patient. CT Lung Screen - not qualified  No orders of the defined types were placed in this encounter.  Meds ordered this encounter  Medications   albuterol  (VENTOLIN  HFA) 108 (90 Base) MCG/ACT inhaler    Sig: Inhale 2 puffs into the lungs every 6 (six) hours as needed for wheezing or shortness of breath.    Dispense:  8 g    Refill:  2    Return if symptoms worsen or fail to improve.  I have spent a total time of  30-minutes on the day of the appointment including chart review, data review, collecting history, coordinating care and discussing medical diagnosis and plan with the patient/family. Past medical history, allergies, medications were reviewed. Pertinent imaging, labs and tests included in this note have been reviewed and interpreted independently by me.  Cleo Villamizar Slater Staff, MD [facility] 02/14/2023 10:52 AM

## 2023-02-14 NOTE — Patient Instructions (Addendum)
 Shortness of breath Elevated right diaphragm --SNIFF XR - negative SNIFF. Not likely a candidate for surgery --Reviewed pulmonary function tests. Restrictive defect but normal lung function. This is suggestive of diaphragm weakness --Holding CT Surgery referral. But can reconsider if symptoms worsen --Encourage regular exercise including yoga, upper body strengthening/rubber bands and cardio exercises five days a week  OSA on CPAP --Continue CPAP nightly --Discuss with your doctor regarding PAP titration to see if you are optimally controlled

## 2023-02-14 NOTE — Progress Notes (Signed)
 Full PFT Performed Today

## 2023-02-14 NOTE — Patient Instructions (Signed)
 Full PFT Performed Today

## 2023-02-19 ENCOUNTER — Encounter (HOSPITAL_BASED_OUTPATIENT_CLINIC_OR_DEPARTMENT_OTHER): Payer: Self-pay | Admitting: Pulmonary Disease

## 2023-02-28 ENCOUNTER — Encounter (HOSPITAL_COMMUNITY): Payer: Self-pay | Admitting: Gastroenterology

## 2023-02-28 NOTE — Progress Notes (Signed)
Attempted to obtain medical history for pre op call via telephone, unable to reach at this time. HIPAA compliant voicemail message left requesting return call to pre surgical testing department.

## 2023-03-01 ENCOUNTER — Institutional Professional Consult (permissible substitution) (HOSPITAL_BASED_OUTPATIENT_CLINIC_OR_DEPARTMENT_OTHER): Payer: Medicare Other | Admitting: Pulmonary Disease

## 2023-03-01 NOTE — Progress Notes (Signed)
  Pre op call eval Name:Keith Elliott  PCP-Shannon Masneri DO Pulmonologist- Dr. Everardo All  PFT-02/14/23 CT chest- 12/18/22 EKG-12/18/22 Echo-n/a Cath-n/a Stress-n/a ICD/PM- n/a Blood thinner-n/a GLP-1-n/a  Hx: HTN, prostate Cancer, OSA,Guillain Barre, ILD. See's pulmonary for  SOB, last seen 02/14/23. He has chronic bronchitis, elevated R Diaphragm but not a surgical candidate, they said he has normal lung function. Per patient breathing is normal, no use of 02 or breathing treatments. Mobility is good, uses cane for longer distances.  Anesthesia Review: Yes

## 2023-03-08 ENCOUNTER — Encounter (HOSPITAL_COMMUNITY): Payer: Self-pay | Admitting: Gastroenterology

## 2023-03-08 ENCOUNTER — Encounter (HOSPITAL_COMMUNITY)
Admission: RE | Disposition: A | Discharge status: Home / Self Care | Payer: Self-pay | Source: Home / Self Care | Attending: Gastroenterology

## 2023-03-08 ENCOUNTER — Ambulatory Visit (HOSPITAL_COMMUNITY): Payer: Medicare Other | Admitting: Anesthesiology

## 2023-03-08 ENCOUNTER — Ambulatory Visit (HOSPITAL_COMMUNITY)
Admission: RE | Admit: 2023-03-08 | Discharge: 2023-03-08 | Disposition: A | Discharge status: Home / Self Care | Payer: Medicare Other | Attending: Gastroenterology | Admitting: Gastroenterology

## 2023-03-08 ENCOUNTER — Other Ambulatory Visit: Payer: Self-pay

## 2023-03-08 DIAGNOSIS — Z8546 Personal history of malignant neoplasm of prostate: Secondary | ICD-10-CM | POA: Insufficient documentation

## 2023-03-08 DIAGNOSIS — K635 Polyp of colon: Secondary | ICD-10-CM | POA: Diagnosis not present

## 2023-03-08 DIAGNOSIS — D123 Benign neoplasm of transverse colon: Secondary | ICD-10-CM | POA: Diagnosis not present

## 2023-03-08 DIAGNOSIS — Z1211 Encounter for screening for malignant neoplasm of colon: Secondary | ICD-10-CM | POA: Insufficient documentation

## 2023-03-08 DIAGNOSIS — K5730 Diverticulosis of large intestine without perforation or abscess without bleeding: Secondary | ICD-10-CM | POA: Diagnosis not present

## 2023-03-08 DIAGNOSIS — I10 Essential (primary) hypertension: Secondary | ICD-10-CM | POA: Insufficient documentation

## 2023-03-08 DIAGNOSIS — Z87891 Personal history of nicotine dependence: Secondary | ICD-10-CM | POA: Diagnosis not present

## 2023-03-08 DIAGNOSIS — Z139 Encounter for screening, unspecified: Secondary | ICD-10-CM | POA: Diagnosis not present

## 2023-03-08 DIAGNOSIS — Z860100 Personal history of colon polyps, unspecified: Secondary | ICD-10-CM | POA: Diagnosis not present

## 2023-03-08 HISTORY — PX: POLYPECTOMY: SHX5525

## 2023-03-08 HISTORY — PX: COLONOSCOPY WITH PROPOFOL: SHX5780

## 2023-03-08 SURGERY — COLONOSCOPY WITH PROPOFOL
Anesthesia: Monitor Anesthesia Care

## 2023-03-08 MED ORDER — PROPOFOL 500 MG/50ML IV EMUL
INTRAVENOUS | Status: AC
  Filled 2023-03-08: qty 50

## 2023-03-08 MED ORDER — SODIUM CHLORIDE 0.9 % IV SOLN: INTRAVENOUS | Status: DC | PRN

## 2023-03-08 MED ORDER — PROPOFOL 500 MG/50ML IV EMUL
INTRAVENOUS | Status: DC | PRN
  Administered 2023-03-08: 75 ug/kg/min via INTRAVENOUS

## 2023-03-08 MED ORDER — LIDOCAINE HCL (PF) 2 % IJ SOLN
INTRAMUSCULAR | Status: DC | PRN
  Administered 2023-03-08: 80 mg via INTRADERMAL

## 2023-03-08 MED ORDER — PROPOFOL 10 MG/ML IV BOLUS
INTRAVENOUS | Status: DC | PRN
  Administered 2023-03-08: 50 mg via INTRAVENOUS
  Administered 2023-03-08: 30 mg via INTRAVENOUS

## 2023-03-08 SURGICAL SUPPLY — 21 items
ELECT REM PT RETURN 9FT ADLT (ELECTROSURGICAL) IMPLANT
ELECTRODE REM PT RTRN 9FT ADLT (ELECTROSURGICAL) IMPLANT
FCP BXJMBJMB 240X2.8X (CUTTING FORCEPS)
FLOOR PAD 36X40 (MISCELLANEOUS) ×2 IMPLANT
FORCEPS BIOP RAD 4 LRG CAP 4 (CUTTING FORCEPS) IMPLANT
FORCEPS BIOP RJ4 240 W/NDL (CUTTING FORCEPS) IMPLANT
FORCEPS BXJMBJMB 240X2.8X (CUTTING FORCEPS) IMPLANT
INJECTOR/SNARE I SNARE (MISCELLANEOUS) IMPLANT
LUBRICANT JELLY 4.5OZ STERILE (MISCELLANEOUS) IMPLANT
MANIFOLD NEPTUNE II (INSTRUMENTS) IMPLANT
NDL SCLEROTHERAPY 25GX240 (NEEDLE) IMPLANT
NEEDLE SCLEROTHERAPY 25GX240 (NEEDLE) IMPLANT
PAD FLOOR 36X40 (MISCELLANEOUS) ×2 IMPLANT
PROBE APC STR FIRE (PROBE) IMPLANT
PROBE INJECTION GOLD 7FR (MISCELLANEOUS) IMPLANT
SNARE ROTATE MED OVAL 20MM (MISCELLANEOUS) IMPLANT
SYR 50ML LL SCALE MARK (SYRINGE) IMPLANT
TRAP SPECIMEN MUCOUS 40CC (MISCELLANEOUS) IMPLANT
TUBING ENDO SMARTCAP PENTAX (MISCELLANEOUS) IMPLANT
TUBING IRRIGATION ENDOGATOR (MISCELLANEOUS) ×2 IMPLANT
WATER STERILE IRR 1000ML POUR (IV SOLUTION) IMPLANT

## 2023-03-08 NOTE — Discharge Instructions (Signed)
YOU HAD AN ENDOSCOPIC PROCEDURE TODAY: Refer to the procedure report and other information in the discharge instructions given to you for any specific questions about what was found during the examination. If this information does not answer your questions, please call [facility] at [phone] to clarify.   YOU SHOULD EXPECT: Some feelings of bloating in the abdomen. Passage of more gas than usual. Walking can help get rid of the air that was put into your GI tract during the procedure and reduce the bloating. If you had a lower endoscopy (such as a colonoscopy or flexible sigmoidoscopy) you may notice spotting of blood in your stool or on the toilet paper. Some abdominal soreness may be present for a day or two, also.  DIET: Your first meal following the procedure should be a light meal and then it is ok to progress to your normal diet. A half-sandwich or bowl of soup is an example of a good first meal. Heavy or fried foods are harder to digest and may make you feel nauseous or bloated. Drink plenty of fluids but you should avoid alcoholic beverages for 24 hours. If you had an esophageal dilation, please see attached information for diet.   ACTIVITY: Your care partner should take you home directly after the procedure. You should plan to take it easy, moving slowly for the rest of the day. You can resume normal activity the day after the procedure however YOU SHOULD NOT DRIVE, use power tools, machinery or perform tasks that involve climbing or major physical exertion for 24 hours (because of the sedation medicines used during the test).   SYMPTOMS TO REPORT IMMEDIATELY: A gastroenterologist can be reached at any hour. Please call [phone]  for any of the following symptoms:  Following lower endoscopy (colonoscopy, flexible sigmoidoscopy) Excessive amounts of blood in the stool  Significant tenderness, worsening of abdominal pains  Swelling of the abdomen that is new, acute  Fever of  100 or higher  Following upper endoscopy (EGD, EUS, ERCP, esophageal dilation) Vomiting of blood or coffee ground material  New, significant abdominal pain  New, significant chest pain or pain under the shoulder blades  Painful or persistently difficult swallowing  New shortness of breath  Black, tarry-looking or red, bloody stools  FOLLOW UP:  If any biopsies were taken you will be contacted by phone or by letter within the next 1-3 weeks. Call [phone]  if you have not heard about the biopsies in 3 weeks.  Please also call with any specific questions about appointments or follow up tests.

## 2023-03-08 NOTE — Op Note (Signed)
[facility] Patient Name: Keith Elliott Procedure Date: 03/08/2023 MRN: [id number] Attending MD: Jeani Hawking , MD, [id number] Date of Birth: 28-Jul-1955 CSN: [id number] Age: 68 Admit Type: Outpatient Procedure:                Colonoscopy Indications:              High risk colon cancer surveillance: Personal                            history of colonic polyps Providers:                Jeani Hawking, MD, Lorenza Evangelist, RN, Rhodia Albright,                            Technician Referring MD:              Medicines:                 Complications:            No immediate complications. Estimated Blood Loss:     Estimated blood loss: none. Procedure:                Pre-Anesthesia Assessment:                           - Prior to the procedure, a History and Physical                            was performed, and patient medications and                            allergies were reviewed. The patient's tolerance of                            previous anesthesia was also reviewed. The risks                            and benefits of the procedure and the sedation                            options and risks were discussed with the patient.                            All questions were answered, and informed consent                            was obtained. Prior Anticoagulants: The patient has                            taken no anticoagulant or antiplatelet agents. ASA                            Grade Assessment: III - A patient with severe                            systemic disease. After  reviewing the risks and                            benefits, the patient was deemed in satisfactory                            condition to undergo the procedure.                           - Sedation was administered by an anesthesia                            professional. Deep sedation was attained.                           After obtaining informed consent, the colonoscope                             was passed under direct vision. Throughout the                            procedure, the patient's blood pressure, pulse, and                            oxygen saturations were monitored continuously. The                            CF-HQ190L ([id number]) Olympus colonoscope was                            introduced through the anus and advanced to the the                            cecum, identified by appendiceal orifice and                            ileocecal valve. The colonoscopy was performed                            without difficulty. The patient tolerated the                            procedure well. The quality of the bowel                            preparation was evaluated using the BBPS [facility]                            Bowel Preparation Scale) with scores of: Right                            Colon = 2 (minor amount of residual staining, small  fragments of stool and/or opaque liquid, but mucosa                            seen well), Transverse Colon = 3 (entire mucosa                            seen well with no residual staining, small                            fragments of stool or opaque liquid) and Left Colon                            = 2 (minor amount of residual staining, small                            fragments of stool and/or opaque liquid, but mucosa                            seen well). The total BBPS score equals 7. The                            quality of the bowel preparation was good. The                            ileocecal valve, appendiceal orifice, and rectum                            were photographed. Scope In: 10:23:47 AM Scope Out: 10:37:05 AM Scope Withdrawal Time: 0 hours 8 minutes 47 seconds  Total Procedure Duration: 0 hours 13 minutes 18 seconds  Findings:      A 3 mm polyp was found in the transverse colon. The polyp was sessile.       The polyp was removed with a cold snare. Resection and retrieval were        complete.      Scattered small-mouthed diverticula were found in the sigmoid colon. Impression:               - One 3 mm polyp in the transverse colon, removed                            with a cold snare. Resected and retrieved.                           - Diverticulosis in the sigmoid colon. Moderate Sedation:      Not Applicable - Patient had care per Anesthesia. Recommendation:           - Patient has a contact number available for                            emergencies. The signs and symptoms of potential                            delayed complications were discussed with the  patient. Return to normal activities tomorrow.                            Written discharge instructions were provided to the                            patient.                           - Resume previous diet.                           - Continue present medications.                           - Await pathology results.                           - Repeat colonoscopy in 5 years for surveillance.                            His index colonoscopy was positive for 4 adenomas. Procedure Code(s):        --- Professional ---                           [id number], Colonoscopy, flexible; with removal of                            tumor(s), polyp(s), or other lesion(s) by snare                            technique Diagnosis Code(s):        --- Professional ---                           Z86.010, Personal history of colonic polyps                           D12.3, Benign neoplasm of transverse colon (hepatic                            flexure or splenic flexure)                           K57.30, Diverticulosis of large intestine without                            perforation or abscess without bleeding CPT copyright 2022 American Medical Association. All rights reserved. The codes documented in this report are preliminary and upon coder review may  be revised to meet current compliance  requirements. Jeani Hawking, MD Jeani Hawking, MD 03/08/2023 10:53:33 AM This report has been signed electronically. Number of Addenda: 0

## 2023-03-08 NOTE — Transfer of Care (Signed)
Immediate Anesthesia Transfer of Care Note  Patient: Keith Elliott  Procedure(s) Performed: COLONOSCOPY WITH PROPOFOL POLYPECTOMY  Patient Location: PACU  Anesthesia Type:MAC  Level of Consciousness: awake, alert , and oriented  Airway & Oxygen Therapy: Patient Spontanous Breathing and Patient connected to face mask oxygen  Post-op Assessment: Report given to RN and Post -op Vital signs reviewed and stable  Post vital signs: Reviewed and stable  Last Vitals:  Vitals Value Taken Time  BP 137/73 03/08/23 1047  Temp    Pulse 57 03/08/23 1049  Resp 18 03/08/23 1049  SpO2 100 % 03/08/23 1049  Vitals shown include unfiled device data.  Last Pain:  Vitals:   03/08/23 0737  TempSrc: Temporal  PainSc: 0-No pain         Complications: No notable events documented.

## 2023-03-08 NOTE — H&P (Signed)
Keith Elliott HPI: At this time the patient denies any problems with nausea, vomiting, fevers, chills, abdominal pain, diarrhea, constipation, melena, GERD, or dysphagia. The patient denies any known family history of colon cancers. No complaints of chest pain, SOB, or MI.  For the past year he reports having painless hematochezia.  His colonoscopy in 11/2019 was positive for 5 adenomas.  He states that he notices the blood on the toilet tissue and in the toilet bowel at times.  The patient does not report any problems with proctalgia.  With his CPAP his sleep apena markedly improved from an AHI in the 70's down to less than one per day.  Past Medical History:  Diagnosis Date   Guillain Barr syndrome [facility])    Hypertension    Prostate cancer [facility])     Past Surgical History:  Procedure Laterality Date   orthoscopic knee surgery  1990   VASECTOMY  2004    Family History  Problem Relation Age of Onset   Alzheimer's disease Father     Social History:  reports that he quit smoking about 46 years ago. His smoking use included cigarettes. He has been exposed to tobacco smoke. He has never used smokeless tobacco. He reports that he does not currently use alcohol. He reports that he does not currently use drugs.  Allergies:  Allergies  Allergen Reactions   Haemophilus Influenzae Vaccines Other (See Comments)    Guillian-Barre   Penicillins Rash    Medications: Scheduled: Continuous:  No results found for this or any previous visit (from the past 24 hours).   No results found.  ROS:  As stated above in the HPI otherwise negative.  Blood pressure 122/68, pulse (!) 56, temperature 97.7 F (36.5 C), temperature source Temporal, resp. rate 16, height 5' 8.75" (1.746 m), weight 99.3 kg, SpO2 97%.    PE: Gen: NAD, Alert and Oriented HEENT:  [place]/AT, EOMI Neck: Supple, no LAD Lungs: CTA Bilaterally CV: RRR without M/G/R ABD: Soft, NTND, +BS Ext: No C/C/E  Assessment/Plan: 1)  Personal history of polyps - colonoscopy.  Isaic Syler D 03/08/2023, 9:56 AM

## 2023-03-08 NOTE — Anesthesia Preprocedure Evaluation (Addendum)
Anesthesia Evaluation  Patient identified by MRN, date of birth, ID band Patient awake    Reviewed: Allergy & Precautions, NPO status , Patient's Chart, lab work & pertinent test results  Airway Mallampati: II  TM Distance: >3 FB Neck ROM: Full    Dental  (+) Teeth Intact, Dental Advisory Given   Pulmonary former smoker   breath sounds clear to auscultation       Cardiovascular hypertension, Pt. on medications  Rhythm:Regular Rate:Normal     Neuro/Psych  Neuromuscular disease  negative psych ROS   GI/Hepatic negative GI ROS, Neg liver ROS,,,  Endo/Other  negative endocrine ROS    Renal/GU negative Renal ROS     Musculoskeletal negative musculoskeletal ROS (+)    Abdominal   Peds  Hematology negative hematology ROS (+)   Anesthesia Other Findings   Reproductive/Obstetrics                             Anesthesia Physical Anesthesia Plan  ASA: 2  Anesthesia Plan: MAC   Post-op Pain Management: Minimal or no pain anticipated   Induction: Intravenous  PONV Risk Score and Plan: Propofol infusion  Airway Management Planned: Natural Airway and Nasal Cannula  Additional Equipment: None  Intra-op Plan:   Post-operative Plan:   Informed Consent: I have reviewed the patients History and Physical, chart, labs and discussed the procedure including the risks, benefits and alternatives for the proposed anesthesia with the patient or authorized representative who has indicated his/her understanding and acceptance.       Plan Discussed with: CRNA  Anesthesia Plan Comments:        Anesthesia Quick Evaluation

## 2023-03-08 NOTE — Anesthesia Postprocedure Evaluation (Signed)
Anesthesia Post Note  Patient: Keith Elliott  Procedure(s) Performed: COLONOSCOPY WITH PROPOFOL POLYPECTOMY     Patient location during evaluation: PACU Anesthesia Type: MAC Level of consciousness: awake and alert Pain management: pain level controlled Vital Signs Assessment: post-procedure vital signs reviewed and stable Respiratory status: spontaneous breathing, nonlabored ventilation, respiratory function stable and patient connected to nasal cannula oxygen Cardiovascular status: stable and blood pressure returned to baseline Postop Assessment: no apparent nausea or vomiting Anesthetic complications: no  No notable events documented.  Last Vitals:  Vitals:   03/08/23 1100 03/08/23 1110  BP: 139/81 (!) 151/75  Pulse: (!) 52 (!) 50  Resp: 18 18  Temp:    SpO2: 98% 96%    Last Pain:  Vitals:   03/08/23 1110  TempSrc:   PainSc: 0-No pain                 Shelton Silvas

## 2023-03-11 ENCOUNTER — Encounter (HOSPITAL_COMMUNITY): Payer: Self-pay | Admitting: Gastroenterology

## 2023-03-11 LAB — SURGICAL PATHOLOGY

## 2023-05-13 DIAGNOSIS — E119 Type 2 diabetes mellitus without complications: Secondary | ICD-10-CM | POA: Diagnosis not present

## 2023-05-13 DIAGNOSIS — I10 Essential (primary) hypertension: Secondary | ICD-10-CM | POA: Diagnosis not present

## 2023-05-13 DIAGNOSIS — E785 Hyperlipidemia, unspecified: Secondary | ICD-10-CM | POA: Diagnosis not present

## 2023-09-26 ENCOUNTER — Other Ambulatory Visit: Payer: Self-pay | Admitting: Urology

## 2023-09-26 DIAGNOSIS — C61 Malignant neoplasm of prostate: Secondary | ICD-10-CM

## 2023-10-26 ENCOUNTER — Ambulatory Visit
Admission: RE | Admit: 2023-10-26 | Discharge: 2023-10-26 | Disposition: A | Discharge status: Home / Self Care | Source: Ambulatory Visit | Attending: Urology | Admitting: Urology

## 2023-10-26 DIAGNOSIS — C61 Malignant neoplasm of prostate: Secondary | ICD-10-CM

## 2023-10-26 MED ORDER — GADOPICLENOL 0.5 MMOL/ML IV SOLN
9.0000 mL | Freq: Once | INTRAVENOUS | Status: AC | PRN
  Administered 2023-10-26: 9 mL via INTRAVENOUS

## 2023-10-28 DIAGNOSIS — B353 Tinea pedis: Secondary | ICD-10-CM | POA: Diagnosis not present

## 2023-10-28 DIAGNOSIS — I10 Essential (primary) hypertension: Secondary | ICD-10-CM | POA: Diagnosis not present

## 2023-10-28 DIAGNOSIS — E119 Type 2 diabetes mellitus without complications: Secondary | ICD-10-CM | POA: Diagnosis not present

## 2023-10-28 DIAGNOSIS — G4733 Obstructive sleep apnea (adult) (pediatric): Secondary | ICD-10-CM | POA: Diagnosis not present

## 2023-11-30 IMAGING — MR MR PROSTATE WO/W CM
12 series · 48 of 48 positions shown · IV contrast (multihance)
Comparison: 09/26/2019

CLINICAL DATA: Low risk prostate carcinoma.  Active surveillance.

EXAM:
MR PROSTATE WITHOUT AND WITH CONTRAST
TECHNIQUE: Multiplanar multisequence MRI images were obtained of the pelvis
centered about the prostate. Pre and post contrast images were
obtained.
CONTRAST:  20mL MULTIHANCE GADOBENATE DIMEGLUMINE 529 MG/ML IV SOLN

[Series 3: T2 · coronal · 3.0mm · 0.56mm/px · 1 of 25 slices shown (1 of 3)]
[im 1/25]
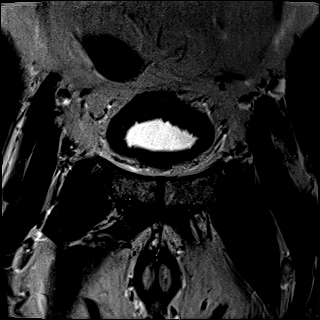

[Series 4: T1 · axial · 5.0mm · 1.25mm/px · 1 of 80 slices shown]
[im 1/80]
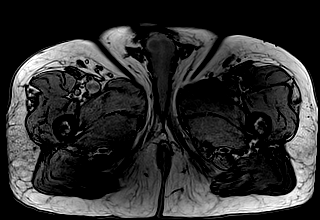

[Series 5: DWI · axial · 3.0mm · 1.93mm/px · 1 of 83 slices shown (1 of 3)]
[im 1/83]
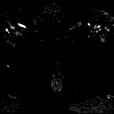

[Series 6: DWI · axial · 3.0mm · 1.93mm/px · 1 of 28 slices shown (2 of 3)]
[im 1/28]
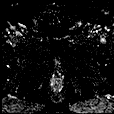

[Series 7: DWI · axial · 3.0mm · 1.93mm/px · 1 of 28 slices shown (3 of 3)]
[im 1/28]
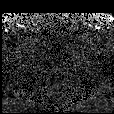

[Series 8: T2 · axial · 3.0mm · 0.62mm/px · 1 of 28 slices shown (2 of 3)]
[im 1/28]
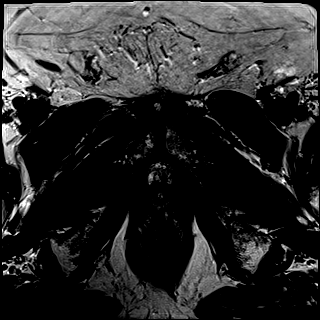

[Series 9: T2 · axial · 1.0mm · 1.04mm/px · z∈[+34,+113]mm · 2 of 80 slices shown (3 of 3)]
[im 1/80]
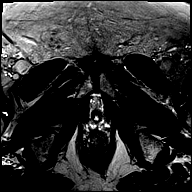
[im 80/80]
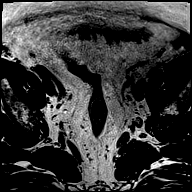

[Series 10: pre t1_twist_tra_dyn · axial · non-contrast · 3.5mm · 0.94mm/px · 1 of 24 slices shown]
[im 1/24]
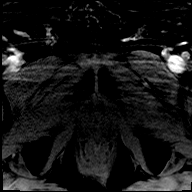

[Series 11: post t1_twist_tra_dyn-copy center · axial · non-contrast · 3.5mm · 0.94mm/px · z∈[+33,+113]mm · 18 of 720 slices shown]
[im 1/720]
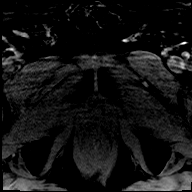
[im 43/720]
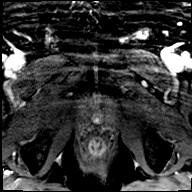
[im 85/720]
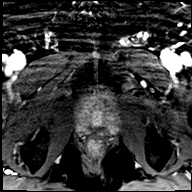
[im 127/720]
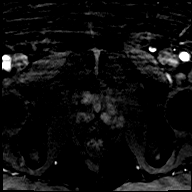
[im 170/720]
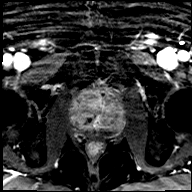
[im 212/720]
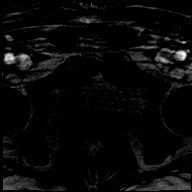
[im 254/720]
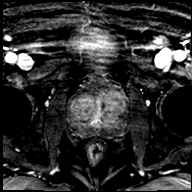
[im 297/720]
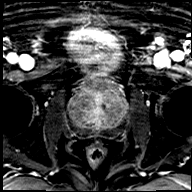
[im 339/720]
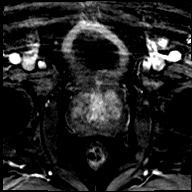
[im 381/720]
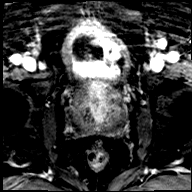
[im 423/720]
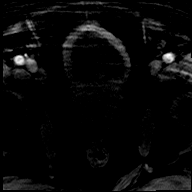
[im 466/720]
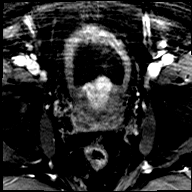
[im 508/720]
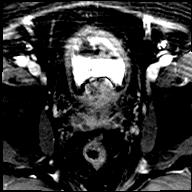
[im 550/720]
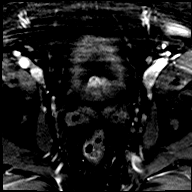
[im 593/720]
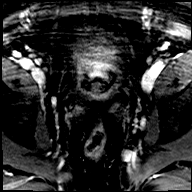
[im 635/720]
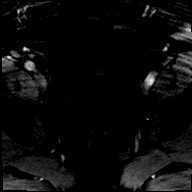
[im 677/720]
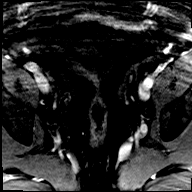
[im 720/720]
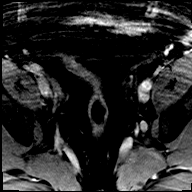

[Series 12: post t1_twist_tra_dyn-copy cent_sub · axial · 3.5mm · 0.94mm/px · z∈[+33,+113]mm · 17 of 696 slices shown]
[im 1/696]
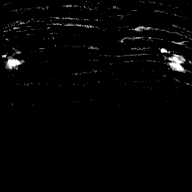
[im 44/696]
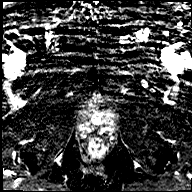
[im 87/696]
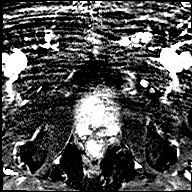
[im 131/696]
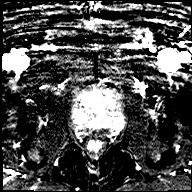
[im 174/696]
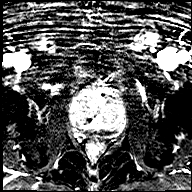
[im 218/696]
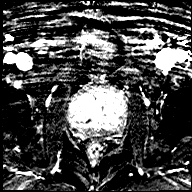
[im 261/696]
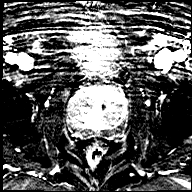
[im 305/696]
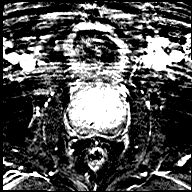
[im 348/696]
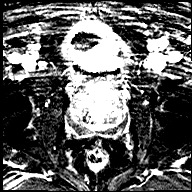
[im 391/696]
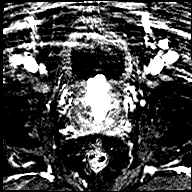
[im 435/696]
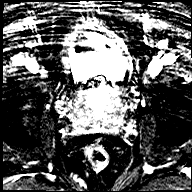
[im 478/696]
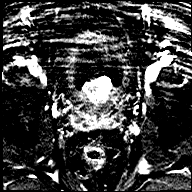
[im 522/696]
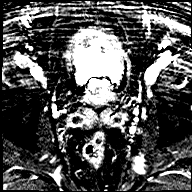
[im 565/696]
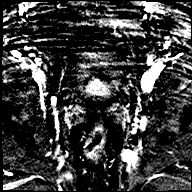
[im 609/696]
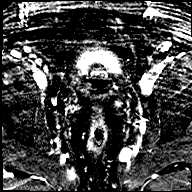
[im 652/696]
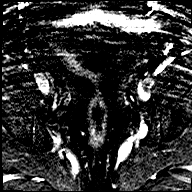
[im 696/696]
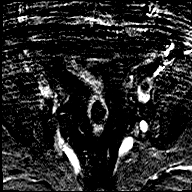

[Series 13: t1_vibe_dixon_tra_f · axial · 2.5mm · 0.91mm/px · z∈[-6,+191]mm · 2 of 80 slices shown]
[im 1/80]
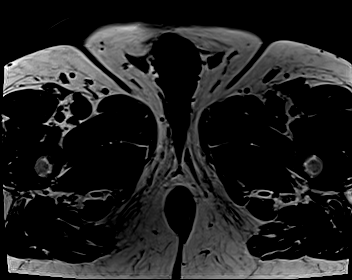
[im 80/80]
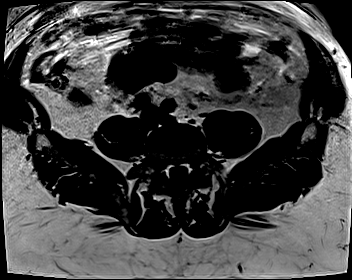

[Series 14: t1_vibe_dixon_tra_w · axial · 2.5mm · 0.91mm/px · z∈[-6,+191]mm · 2 of 80 slices shown]
[im 1/80]
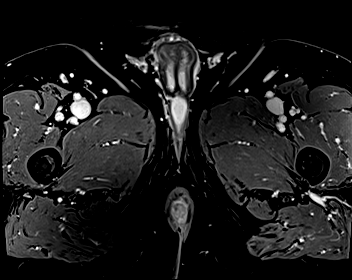
[im 80/80]
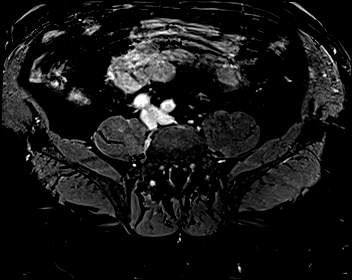

[48 of 48 positions shown; findings below may reference images not displayed]

FINDINGS: Prostate:

-- Peripheral Zone: A T2 hypointense lesion is again seen in the
left lateral mid gland and apex, which measures 1.4 x 0.8 cm on
image 65/9. This is not significant changed in size or appearance.
This lesion shows marked ADC hypointensity, moderate DWI
hyperintensity, and early focal contrast enhancement. PI-RADS 4

A 6 mm T2 hypointense nodule is seen in the right lateral mid gland
measuring 6 x 4 mm on image 57/9. This nodule shows marked ADC
hypointensity, but no DWI hyperintensity or early focal contrast
enhancement. PI-RADS 3

-- Transition/Central Zone: Moderately enlarged with multiple BPH
nodules and median lobe hypertrophy indenting the bladder base. No
nodules with suspicious characteristics on T2-weighted imaging.

-- Measurements/Volume:  7.4 by 5.1 x 6.0 cm (volume = 120 cm^3)

Transcapsular spread:  Absent

Seminal vesicle involvement:  Absent

Neurovascular bundle involvement:  Absent

Pelvic adenopathy: None visualized

Bone metastasis: None visualized

Other: Diffuse bladder wall thickening, consistent with chronic
bladder outlet obstruction. Sigmoid diverticulosis, without evidence
of diverticulitis. An enhancing solid soft tissue mass is seen in
the mid sacrum measuring approximately 4.3 x 4.3 cm. This could
represent a neurogenic tumor or bone tumor.
IMPRESSION: Persistent 1.4 cm peripheral zone lesion in the left lateral mid
gland and apex, which remains suspicious suspicious for high-grade
carcinoma. PI-RADS 4 (v2.1): High (clinically significant cancer
likely)

6 mm peripheral zone nodule in the right lateral mid gland. PI-RADS
3 (v2.1): Intermediate (clinically significant cancer equivocal)

(I have post-processed this exam in the DynaCAD application for
potential fusion-guided biopsy.)

4.3 cm enhancing solid soft tissue mass in the mid sacrum. This
could represent a neurogenic tumor or bone tumor. Recommend lumbar
spine and pelvic MRIs without and with contrast for further
evaluation. These results will be called to the ordering clinician
or representative by the Radiologist Assistant, and communication
documented in the PACS or [REDACTED].

## 2024-02-24 ENCOUNTER — Emergency Department (HOSPITAL_BASED_OUTPATIENT_CLINIC_OR_DEPARTMENT_OTHER)
Admission: EM | Admit: 2024-02-24 | Discharge: 2024-02-24 | Disposition: A | Discharge status: Home / Self Care | Attending: Emergency Medicine | Admitting: Emergency Medicine

## 2024-02-24 ENCOUNTER — Emergency Department (HOSPITAL_BASED_OUTPATIENT_CLINIC_OR_DEPARTMENT_OTHER)

## 2024-02-24 ENCOUNTER — Other Ambulatory Visit: Payer: Self-pay

## 2024-02-24 ENCOUNTER — Encounter (HOSPITAL_BASED_OUTPATIENT_CLINIC_OR_DEPARTMENT_OTHER): Payer: Self-pay | Admitting: *Deleted

## 2024-02-24 DIAGNOSIS — S6991XA Unspecified injury of right wrist, hand and finger(s), initial encounter: Secondary | ICD-10-CM | POA: Diagnosis present

## 2024-02-24 DIAGNOSIS — S52571A Other intraarticular fracture of lower end of right radius, initial encounter for closed fracture: Secondary | ICD-10-CM | POA: Insufficient documentation

## 2024-02-24 DIAGNOSIS — M25531 Pain in right wrist: Secondary | ICD-10-CM | POA: Diagnosis present

## 2024-02-24 DIAGNOSIS — W1839XA Other fall on same level, initial encounter: Secondary | ICD-10-CM | POA: Insufficient documentation

## 2024-02-24 MED ORDER — HYDROCODONE-ACETAMINOPHEN 5-325 MG PO TABS: 1.0000 | ORAL_TABLET | ORAL | 0 refills | Status: AC | PRN

## 2024-02-24 NOTE — ED Provider Notes (Signed)
 "  EMERGENCY DEPARTMENT AT [facility] Provider Note   CSN: [id number] Arrival date & time: 02/24/24  2024     Patient presents with: Keith Elliott is a 69 y.o. male.   Patient to ED with right wrist injury after mechanical fall earlier tonight, landing onto knees and outstretched arms causing right wrist injury. Not anticoagulated. He has been ambulatory since the fall. No head injury, chest or abdominal pain.   The history is provided by the patient. No language interpreter was used.  Fall       Prior to Admission medications  Medication Sig Start Date End Date Taking? Authorizing Provider  HYDROcodone -acetaminophen  (NORCO/VICODIN) 5-325 MG tablet Take 1 tablet by mouth every 4 (four) hours as needed for severe pain (pain score 7-10) or moderate pain (pain score 4-6). 02/24/24  Yes Briana Farner, Margit, PA-C  albuterol  (VENTOLIN  HFA) 108 (90 Base) MCG/ACT inhaler Inhale 2 puffs into the lungs every 6 (six) hours as needed for wheezing or shortness of breath. 02/14/23   Kassie Acquanetta Bradley, MD  alfuzosin (UROXATRAL) 10 MG 24 hr tablet Take 10 mg by mouth daily.    [provider]  losartan (COZAAR) 25 MG tablet Take 25 mg by mouth daily.    [provider]    Allergies: Haemophilus influenzae vaccines and Penicillins    Review of Systems  Updated Vital Signs BP (!) 183/80 (BP Location: Left Arm)   Pulse 66   Temp 98 F (36.7 C)   Resp 18   SpO2 96%   Physical Exam Vitals and nursing note reviewed.  Constitutional:      General: He is not in acute distress.    Appearance: Normal appearance.  HENT:     Head: Atraumatic.  Neck:     Comments: No midline tenderness.  Cardiovascular:     Rate and Rhythm: Normal rate.  Pulmonary:     Effort: Pulmonary effort is normal.  Musculoskeletal:     Cervical back: Normal range of motion and neck supple.     Comments: Right wrist has swelling dorsally without deformity or discoloration. Able to  move all digits of the hand. Neurovascularly intact. No proximal forearm or elbow tenderness.   Skin:    General: Skin is warm and dry.  Neurological:     Mental Status: He is alert and oriented to person, place, and time.     (all labs ordered are listed, but only abnormal results are displayed) Labs Reviewed - No data to display  EKG: None  Radiology: DG Wrist Complete Right Result Date: 02/24/2024 EXAM: 3 or more VIEW(S) XRAY OF THE WRIST 02/24/2024 08:49:00 PM COMPARISON: None available. CLINICAL HISTORY: wrist pain after fall FINDINGS: BONES AND JOINTS: Comminuted fracture of the distal radius with mild apex volar angulation and extension to the radiocarpal joint. 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 SOFT TISSUES: Mild soft tissue swelling. IMPRESSION: 1. Comminuted fracture of the distal radius with mild apex volar angulation and extension to the radiocarpal joint. 2. Mild soft tissue swelling. Electronically signed by: Oneil Devonshire MD MD 02/24/2024 09:32 PM EST RP Workstation: [id number]     Procedures   Medications Ordered in the ED - No data to display  Clinical Course as of 02/24/24 2147  Mon Feb 24, 2024  2140 Patient with mechanical fall tonight, right wrist injury. Right hand dominant patient, not on anticoagulation.   Imaging per radiology:  IMPRESSION: 1. Comminuted fracture of the distal radius with mild  apex volar angulation and extension to the radiocarpal joint. 2. Mild soft tissue swelling.  No other injury suspected. Volar, short-arm splint applied. Patient declines pain medication in ED but will provide Rx for prn home use. Refer to hand ortho [place]).  [SU]    Clinical Course User Index [SU] Odell Balls, PA-C                                 Medical Decision Making Amount and/or Complexity of Data Reviewed Radiology: ordered.        Final diagnoses:  Other closed intra-articular fracture of distal end of right radius,  initial encounter    ED Discharge Orders          Ordered    HYDROcodone -acetaminophen  (NORCO/VICODIN) 5-325 MG tablet  Every 4 hours PRN        02/24/24 2146               Odell Balls, PA-C 02/24/24 2148    Ruthe Cornet, DO 02/24/24 2204  "

## 2024-02-24 NOTE — Discharge Instructions (Addendum)
 As we discussed, ice and elevate to reduce swelling. Follow up with Dr. Alyse by calling the office in the morning to schedule a time to be seen this week. Take Norco for pain as prescribed as needed.

## 2024-02-24 NOTE — ED Notes (Signed)
 Reviewed AVS/discharge instructions with patient. Time allotted for and all questions answered. Patient is agreeable for d/c and escorted to ED exit by staff.

## 2024-02-24 NOTE — ED Triage Notes (Signed)
 Pt reports just about 1 hour ago, he fell and went to brace his fall with his arm, now having pain in the right wrist and forearm. Swelling noted to the right medial wrist, extremity elevated on pillows and ice provided.
